# Patient Record
Sex: Male | Born: 1960 | Race: White | Hispanic: No | Smoking: Current every day smoker
Health system: Southern US, Community
[De-identification: ages and names within clinical notes are randomized; demographics above are authoritative.]

## PROBLEM LIST (undated history)

## (undated) HISTORY — PX: APPENDECTOMY: SHX54

---

## 2006-10-20 ENCOUNTER — Emergency Department: Payer: Self-pay | Admitting: Unknown Physician Specialty

## 2008-07-14 ENCOUNTER — Emergency Department: Payer: Self-pay | Admitting: Emergency Medicine

## 2008-07-16 ENCOUNTER — Emergency Department: Payer: Self-pay | Admitting: Unknown Physician Specialty

## 2009-10-20 ENCOUNTER — Emergency Department: Payer: Self-pay | Admitting: Internal Medicine

## 2009-10-21 ENCOUNTER — Emergency Department: Payer: Self-pay | Admitting: Emergency Medicine

## 2016-01-26 ENCOUNTER — Emergency Department: Payer: 59

## 2016-01-26 ENCOUNTER — Encounter: Payer: Self-pay | Admitting: Emergency Medicine

## 2016-01-26 ENCOUNTER — Emergency Department
Admission: EM | Admit: 2016-01-26 | Discharge: 2016-01-26 | Disposition: A | Discharge status: Home / Self Care | Payer: 59 | Attending: Emergency Medicine | Admitting: Emergency Medicine

## 2016-01-26 DIAGNOSIS — Y999 Unspecified external cause status: Secondary | ICD-10-CM | POA: Diagnosis not present

## 2016-01-26 DIAGNOSIS — Y92410 Unspecified street and highway as the place of occurrence of the external cause: Secondary | ICD-10-CM | POA: Diagnosis not present

## 2016-01-26 DIAGNOSIS — F17200 Nicotine dependence, unspecified, uncomplicated: Secondary | ICD-10-CM | POA: Insufficient documentation

## 2016-01-26 DIAGNOSIS — M25512 Pain in left shoulder: Secondary | ICD-10-CM | POA: Diagnosis not present

## 2016-01-26 DIAGNOSIS — Y9389 Activity, other specified: Secondary | ICD-10-CM | POA: Diagnosis not present

## 2016-01-26 DIAGNOSIS — M7918 Myalgia, other site: Secondary | ICD-10-CM

## 2016-01-26 MED ORDER — HYDROCODONE-ACETAMINOPHEN 5-325 MG PO TABS
1.0000 | ORAL_TABLET | Freq: Once | ORAL | Status: AC
  Administered 2016-01-26: 1 via ORAL
  Filled 2016-01-26: qty 1

## 2016-01-26 MED ORDER — CYCLOBENZAPRINE HCL 10 MG PO TABS
10.0000 mg | ORAL_TABLET | Freq: Once | ORAL | Status: AC
  Administered 2016-01-26: 10 mg via ORAL
  Filled 2016-01-26: qty 1

## 2016-01-26 MED ORDER — CYCLOBENZAPRINE HCL 10 MG PO TABS: 10.0000 mg | ORAL_TABLET | Freq: Three times a day (TID) | ORAL | 0 refills | Status: DC | PRN

## 2016-01-26 MED ORDER — IBUPROFEN 800 MG PO TABS: 800.0000 mg | ORAL_TABLET | Freq: Three times a day (TID) | ORAL | 0 refills | Status: DC | PRN

## 2016-01-26 MED ORDER — IBUPROFEN 800 MG PO TABS
800.0000 mg | ORAL_TABLET | Freq: Once | ORAL | Status: AC
  Administered 2016-01-26: 800 mg via ORAL
  Filled 2016-01-26: qty 1

## 2016-01-26 NOTE — ED Notes (Signed)

## 2016-01-26 NOTE — ED Notes (Signed)
Patient transported to X-ray 

## 2016-01-26 NOTE — ED Triage Notes (Signed)
philly collar applied 
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
## 2016-03-29 ENCOUNTER — Emergency Department: Payer: 59

## 2016-03-29 ENCOUNTER — Encounter: Payer: Self-pay | Admitting: Emergency Medicine

## 2016-03-29 ENCOUNTER — Emergency Department
Admission: EM | Admit: 2016-03-29 | Discharge: 2016-03-29 | Disposition: A | Discharge status: Home / Self Care | Payer: 59 | Attending: Emergency Medicine | Admitting: Emergency Medicine

## 2016-03-29 DIAGNOSIS — Z23 Encounter for immunization: Secondary | ICD-10-CM | POA: Insufficient documentation

## 2016-03-29 DIAGNOSIS — F17200 Nicotine dependence, unspecified, uncomplicated: Secondary | ICD-10-CM | POA: Diagnosis not present

## 2016-03-29 DIAGNOSIS — W3400XA Accidental discharge from unspecified firearms or gun, initial encounter: Secondary | ICD-10-CM | POA: Insufficient documentation

## 2016-03-29 DIAGNOSIS — Y929 Unspecified place or not applicable: Secondary | ICD-10-CM | POA: Insufficient documentation

## 2016-03-29 DIAGNOSIS — Y9389 Activity, other specified: Secondary | ICD-10-CM | POA: Insufficient documentation

## 2016-03-29 DIAGNOSIS — S8991XA Unspecified injury of right lower leg, initial encounter: Secondary | ICD-10-CM | POA: Diagnosis present

## 2016-03-29 DIAGNOSIS — S81801A Unspecified open wound, right lower leg, initial encounter: Secondary | ICD-10-CM | POA: Insufficient documentation

## 2016-03-29 DIAGNOSIS — Z791 Long term (current) use of non-steroidal anti-inflammatories (NSAID): Secondary | ICD-10-CM | POA: Diagnosis not present

## 2016-03-29 DIAGNOSIS — Y999 Unspecified external cause status: Secondary | ICD-10-CM | POA: Diagnosis not present

## 2016-03-29 LAB — COMPREHENSIVE METABOLIC PANEL
ALT: 25 U/L (ref 17–63)
ANION GAP: 11 (ref 5–15)
AST: 35 U/L (ref 15–41)
Albumin: 4.1 g/dL (ref 3.5–5.0)
Alkaline Phosphatase: 64 U/L (ref 38–126)
BUN: 12 mg/dL (ref 6–20)
CALCIUM: 9.3 mg/dL (ref 8.9–10.3)
CHLORIDE: 107 mmol/L (ref 101–111)
CO2: 22 mmol/L (ref 22–32)
Creatinine, Ser: 0.72 mg/dL (ref 0.61–1.24)
GFR calc non Af Amer: >60 mL/min (ref >60)
Glucose, Bld: 91 mg/dL (ref 65–99)
Potassium: 4.6 mmol/L (ref 3.5–5.1)
SODIUM: 140 mmol/L (ref 135–145)
Total Bilirubin: 1 mg/dL (ref 0.3–1.2)
Total Protein: 7.7 g/dL (ref 6.5–8.1)

## 2016-03-29 LAB — CBC
HCT: 41.7 % (ref 40.0–52.0)
Hemoglobin: 14.6 g/dL (ref 13.0–18.0)
MCH: 34.1 pg — AB (ref 26.0–34.0)
MCHC: 35.1 g/dL (ref 32.0–36.0)
MCV: 97.2 fL (ref 80.0–100.0)
PLATELETS: 244 10*3/uL (ref 150–440)
RBC: 4.29 MIL/uL — ABNORMAL LOW (ref 4.40–5.90)
RDW: 14.4 % (ref 11.5–14.5)
WBC: 9.6 10*3/uL (ref 3.8–10.6)

## 2016-03-29 LAB — TYPE AND SCREEN
ABO/RH(D): O POS
ANTIBODY SCREEN: NEGATIVE

## 2016-03-29 MED ORDER — IOPAMIDOL (ISOVUE-370) INJECTION 76%
125.0000 mL | Freq: Once | INTRAVENOUS | Status: AC | PRN
  Administered 2016-03-29: 100 mL via INTRAVENOUS

## 2016-03-29 MED ORDER — MORPHINE SULFATE (PF) 4 MG/ML IV SOLN
4.0000 mg | Freq: Once | INTRAVENOUS | Status: AC
  Administered 2016-03-29: 4 mg via INTRAVENOUS

## 2016-03-29 MED ORDER — MORPHINE SULFATE (PF) 4 MG/ML IV SOLN
INTRAVENOUS | Status: AC
  Filled 2016-03-29: qty 1

## 2016-03-29 MED ORDER — TETANUS-DIPHTH-ACELL PERTUSSIS 5-2.5-18.5 LF-MCG/0.5 IM SUSP
0.5000 mL | Freq: Once | INTRAMUSCULAR | Status: AC
  Administered 2016-03-29: 0.5 mL via INTRAMUSCULAR

## 2016-03-29 MED ORDER — ONDANSETRON HCL 4 MG/2ML IJ SOLN
4.0000 mg | Freq: Once | INTRAMUSCULAR | Status: AC
  Administered 2016-03-29: 4 mg via INTRAVENOUS
  Filled 2016-03-29: qty 2

## 2016-03-29 MED ORDER — TETANUS-DIPHTH-ACELL PERTUSSIS 5-2.5-18.5 LF-MCG/0.5 IM SUSP
INTRAMUSCULAR | Status: AC
  Filled 2016-03-29: qty 0.5

## 2016-03-29 MED ORDER — MORPHINE SULFATE (PF) 4 MG/ML IV SOLN
4.0000 mg | Freq: Once | INTRAVENOUS | Status: AC
  Administered 2016-03-29: 4 mg via INTRAVENOUS
  Filled 2016-03-29: qty 1

## 2016-03-29 MED ORDER — OXYCODONE-ACETAMINOPHEN 5-325 MG PO TABS: 1.0000 | ORAL_TABLET | Freq: Four times a day (QID) | ORAL | 0 refills | Status: AC | PRN

## 2016-03-29 NOTE — ED Notes (Signed)
Call bell at right side. Bed adjusted for comfort and warm blankets draped on pt.

## 2016-03-29 NOTE — ED Notes (Signed)
Clothing placed in paper bags.
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
## 2016-03-29 NOTE — ED Notes (Signed)
Report to rebecca, rn.  
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
## 2016-03-29 NOTE — ED Notes (Signed)
Received report from April RN.
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
## 2016-03-29 NOTE — ED Notes (Signed)
Patient transported to CT 
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
## 2017-12-08 ENCOUNTER — Emergency Department: Payer: 59

## 2017-12-08 ENCOUNTER — Encounter: Payer: Self-pay | Admitting: Emergency Medicine

## 2017-12-08 ENCOUNTER — Other Ambulatory Visit: Payer: Self-pay

## 2017-12-08 ENCOUNTER — Emergency Department
Admission: EM | Admit: 2017-12-08 | Discharge: 2017-12-08 | Disposition: A | Discharge status: Home / Self Care | Payer: 59 | Attending: Emergency Medicine | Admitting: Emergency Medicine

## 2017-12-08 DIAGNOSIS — R0781 Pleurodynia: Secondary | ICD-10-CM

## 2017-12-08 DIAGNOSIS — F17200 Nicotine dependence, unspecified, uncomplicated: Secondary | ICD-10-CM | POA: Diagnosis not present

## 2017-12-08 MED ORDER — LIDOCAINE 5 % EX PTCH: 1.0000 | MEDICATED_PATCH | CUTANEOUS | 0 refills | Status: AC

## 2017-12-08 MED ORDER — IBUPROFEN 600 MG PO TABS: 600.0000 mg | ORAL_TABLET | Freq: Four times a day (QID) | ORAL | 0 refills | Status: DC | PRN

## 2017-12-08 MED ORDER — LIDOCAINE 5 % EX PTCH
1.0000 | MEDICATED_PATCH | CUTANEOUS | Status: DC
  Administered 2017-12-08: 1 via TRANSDERMAL
  Filled 2017-12-08: qty 1

## 2017-12-08 MED ORDER — TRAMADOL HCL 50 MG PO TABS: 50.0000 mg | ORAL_TABLET | Freq: Four times a day (QID) | ORAL | 0 refills | Status: DC | PRN

## 2017-12-08 MED ORDER — KETOROLAC TROMETHAMINE 30 MG/ML IJ SOLN
30.0000 mg | Freq: Once | INTRAMUSCULAR | Status: AC
  Administered 2017-12-08: 30 mg via INTRAMUSCULAR
  Filled 2017-12-08: qty 1
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
## 2017-12-08 NOTE — ED Notes (Signed)
Explained importance of deep breathing q 1 hour while awake and bracing with pillow to prevent pneumonia.
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
## 2018-08-31 ENCOUNTER — Inpatient Hospital Stay (HOSPITAL_COMMUNITY)
Admission: EM | Admit: 2018-08-31 | Discharge: 2018-09-02 | DRG: 683 | Discharge status: Against Medical Advice | Payer: Self-pay | Attending: Internal Medicine | Admitting: Internal Medicine

## 2018-08-31 ENCOUNTER — Encounter (HOSPITAL_COMMUNITY): Payer: Self-pay | Admitting: Emergency Medicine

## 2018-08-31 ENCOUNTER — Other Ambulatory Visit (HOSPITAL_COMMUNITY): Payer: Self-pay

## 2018-08-31 ENCOUNTER — Emergency Department (HOSPITAL_COMMUNITY): Payer: Self-pay

## 2018-08-31 ENCOUNTER — Other Ambulatory Visit: Payer: Self-pay

## 2018-08-31 DIAGNOSIS — F10939 Alcohol use, unspecified with withdrawal, unspecified: Secondary | ICD-10-CM

## 2018-08-31 DIAGNOSIS — R825 Elevated urine levels of drugs, medicaments and biological substances: Secondary | ICD-10-CM

## 2018-08-31 DIAGNOSIS — Y900 Blood alcohol level of less than 20 mg/100 ml: Secondary | ICD-10-CM | POA: Diagnosis present

## 2018-08-31 DIAGNOSIS — F10239 Alcohol dependence with withdrawal, unspecified: Secondary | ICD-10-CM | POA: Diagnosis present

## 2018-08-31 DIAGNOSIS — M6282 Rhabdomyolysis: Secondary | ICD-10-CM | POA: Diagnosis present

## 2018-08-31 DIAGNOSIS — F17210 Nicotine dependence, cigarettes, uncomplicated: Secondary | ICD-10-CM | POA: Diagnosis present

## 2018-08-31 DIAGNOSIS — R7401 Elevation of levels of liver transaminase levels: Secondary | ICD-10-CM

## 2018-08-31 DIAGNOSIS — T699XXA Effect of reduced temperature, unspecified, initial encounter: Secondary | ICD-10-CM | POA: Diagnosis present

## 2018-08-31 DIAGNOSIS — N19 Unspecified kidney failure: Secondary | ICD-10-CM

## 2018-08-31 DIAGNOSIS — F10229 Alcohol dependence with intoxication, unspecified: Secondary | ICD-10-CM | POA: Diagnosis present

## 2018-08-31 DIAGNOSIS — N179 Acute kidney failure, unspecified: Principal | ICD-10-CM

## 2018-08-31 DIAGNOSIS — S80212A Abrasion, left knee, initial encounter: Secondary | ICD-10-CM | POA: Diagnosis present

## 2018-08-31 DIAGNOSIS — X31XXXA Exposure to excessive natural cold, initial encounter: Secondary | ICD-10-CM

## 2018-08-31 DIAGNOSIS — Z1159 Encounter for screening for other viral diseases: Secondary | ICD-10-CM

## 2018-08-31 DIAGNOSIS — S80211A Abrasion, right knee, initial encounter: Secondary | ICD-10-CM | POA: Diagnosis present

## 2018-08-31 DIAGNOSIS — R740 Nonspecific elevation of levels of transaminase and lactic acid dehydrogenase [LDH]: Secondary | ICD-10-CM

## 2018-08-31 LAB — RENAL FUNCTION PANEL
Albumin: 3.3 g/dL — ABNORMAL LOW (ref 3.5–5.0)
Anion gap: 15 (ref 5–15)
BUN: 31 mg/dL — ABNORMAL HIGH (ref 6–20)
CO2: 22 mmol/L (ref 22–32)
Calcium: 7 mg/dL — ABNORMAL LOW (ref 8.9–10.3)
Chloride: 99 mmol/L (ref 98–111)
Creatinine, Ser: 4.12 mg/dL — ABNORMAL HIGH (ref 0.61–1.24)
GFR calc Af Amer: 17 mL/min — ABNORMAL LOW (ref >60)
GFR calc non Af Amer: 15 mL/min — ABNORMAL LOW (ref >60)
Glucose, Bld: 140 mg/dL — ABNORMAL HIGH (ref 70–99)
Phosphorus: 9.3 mg/dL — ABNORMAL HIGH (ref 2.5–4.6)
Potassium: 4 mmol/L (ref 3.5–5.1)
Sodium: 136 mmol/L (ref 135–145)

## 2018-08-31 LAB — RAPID URINE DRUG SCREEN, HOSP PERFORMED
Amphetamines: NOT DETECTED
Barbiturates: NOT DETECTED
Benzodiazepines: NOT DETECTED
Cocaine: NOT DETECTED
Opiates: POSITIVE — AB
Tetrahydrocannabinol: POSITIVE — AB

## 2018-08-31 LAB — URINALYSIS, ROUTINE W REFLEX MICROSCOPIC
Bilirubin Urine: NEGATIVE
Glucose, UA: NEGATIVE mg/dL
Ketones, ur: NEGATIVE mg/dL
Nitrite: NEGATIVE
Protein, ur: 100 mg/dL — AB
Specific Gravity, Urine: <1.005 — ABNORMAL LOW (ref 1.005–1.030)
pH: 6 (ref 5.0–8.0)

## 2018-08-31 LAB — COMPREHENSIVE METABOLIC PANEL
ALT: 165 U/L — ABNORMAL HIGH (ref 0–44)
AST: 448 U/L — ABNORMAL HIGH (ref 15–41)
Albumin: 4.5 g/dL (ref 3.5–5.0)
Alkaline Phosphatase: 102 U/L (ref 38–126)
Anion gap: 29 — ABNORMAL HIGH (ref 5–15)
BUN: 20 mg/dL (ref 6–20)
CO2: 12 mmol/L — ABNORMAL LOW (ref 22–32)
Calcium: 9.2 mg/dL (ref 8.9–10.3)
Chloride: 103 mmol/L (ref 98–111)
Creatinine, Ser: 4.71 mg/dL — ABNORMAL HIGH (ref 0.61–1.24)
GFR calc Af Amer: 15 mL/min — ABNORMAL LOW (ref >60)
GFR calc non Af Amer: 13 mL/min — ABNORMAL LOW (ref >60)
Glucose, Bld: 182 mg/dL — ABNORMAL HIGH (ref 70–99)
Potassium: 4.7 mmol/L (ref 3.5–5.1)
Sodium: 144 mmol/L (ref 135–145)
Total Bilirubin: 0.7 mg/dL (ref 0.3–1.2)
Total Protein: 8.4 g/dL — ABNORMAL HIGH (ref 6.5–8.1)

## 2018-08-31 LAB — CBC
HCT: 52.3 % — ABNORMAL HIGH (ref 39.0–52.0)
Hemoglobin: 17.2 g/dL — ABNORMAL HIGH (ref 13.0–17.0)
MCH: 34.3 pg — ABNORMAL HIGH (ref 26.0–34.0)
MCHC: 32.9 g/dL (ref 30.0–36.0)
MCV: 104.2 fL — ABNORMAL HIGH (ref 80.0–100.0)
Platelets: 286 10*3/uL (ref 150–400)
RBC: 5.02 MIL/uL (ref 4.22–5.81)
RDW: 14.5 % (ref 11.5–15.5)
WBC: 20.2 10*3/uL — ABNORMAL HIGH (ref 4.0–10.5)
nRBC: 0 % (ref 0.0–0.2)

## 2018-08-31 LAB — SARS CORONAVIRUS 2 BY RT PCR (HOSPITAL ORDER, PERFORMED IN ~~LOC~~ HOSPITAL LAB): SARS Coronavirus 2: NEGATIVE

## 2018-08-31 LAB — URINALYSIS, MICROSCOPIC (REFLEX)

## 2018-08-31 LAB — CK: Total CK: >50000 U/L — ABNORMAL HIGH (ref 49–397)

## 2018-08-31 LAB — MRSA PCR SCREENING: MRSA by PCR: NEGATIVE

## 2018-08-31 LAB — ETHANOL: Alcohol, Ethyl (B): <10 mg/dL (ref <10)

## 2018-08-31 MED ORDER — THIAMINE HCL 100 MG/ML IJ SOLN
100.0000 mg | Freq: Every day | INTRAMUSCULAR | Status: DC
  Administered 2018-08-31 – 2018-09-02 (×3): 100 mg via INTRAVENOUS
  Filled 2018-08-31 (×3): qty 2

## 2018-08-31 MED ORDER — HEPARIN SODIUM (PORCINE) 5000 UNIT/ML IJ SOLN
5000.0000 [IU] | Freq: Three times a day (TID) | INTRAMUSCULAR | Status: DC
  Administered 2018-08-31 – 2018-09-01 (×5): 5000 [IU] via SUBCUTANEOUS
  Filled 2018-08-31 (×5): qty 1

## 2018-08-31 MED ORDER — SENNOSIDES-DOCUSATE SODIUM 8.6-50 MG PO TABS: 1.0000 | ORAL_TABLET | Freq: Every evening | ORAL | Status: DC | PRN

## 2018-08-31 MED ORDER — CHLORDIAZEPOXIDE HCL 25 MG PO CAPS: 25.0000 mg | ORAL_CAPSULE | Freq: Four times a day (QID) | ORAL | Status: DC

## 2018-08-31 MED ORDER — LACTATED RINGERS IV BOLUS
1000.0000 mL | Freq: Once | INTRAVENOUS | Status: AC
  Administered 2018-08-31: 1000 mL via INTRAVENOUS

## 2018-08-31 MED ORDER — LORAZEPAM 2 MG/ML IJ SOLN
2.0000 mg | INTRAMUSCULAR | Status: DC | PRN
  Administered 2018-08-31 – 2018-09-01 (×4): 2 mg via INTRAVENOUS
  Filled 2018-08-31 (×4): qty 1

## 2018-08-31 MED ORDER — ACETAMINOPHEN 325 MG PO TABS: 650.0000 mg | ORAL_TABLET | Freq: Four times a day (QID) | ORAL | Status: DC | PRN

## 2018-08-31 MED ORDER — SODIUM CHLORIDE 0.9 % IV SOLN
INTRAVENOUS | Status: DC
  Administered 2018-08-31 (×2): via INTRAVENOUS

## 2018-08-31 MED ORDER — NICOTINE 21 MG/24HR TD PT24: 21.0000 mg | MEDICATED_PATCH | Freq: Every day | TRANSDERMAL | Status: DC

## 2018-08-31 MED ORDER — FOLIC ACID 1 MG PO TABS: 1.0000 mg | ORAL_TABLET | Freq: Every day | ORAL | Status: DC

## 2018-08-31 MED ORDER — SODIUM CHLORIDE 0.9 % IV BOLUS
1000.0000 mL | Freq: Once | INTRAVENOUS | Status: AC
  Administered 2018-08-31: 09:00:00 1000 mL via INTRAVENOUS

## 2018-08-31 MED ORDER — PROSIGHT PO TABS
1.0000 | ORAL_TABLET | Freq: Every day | ORAL | Status: DC
  Administered 2018-08-31 – 2018-09-02 (×3): 1 via ORAL
  Filled 2018-08-31 (×4): qty 1

## 2018-08-31 MED ORDER — SODIUM BICARBONATE 8.4 % IV SOLN
INTRAVENOUS | Status: DC
  Administered 2018-08-31 (×2): via INTRAVENOUS
  Filled 2018-08-31 (×4): qty 150

## 2018-08-31 MED ORDER — CHLORDIAZEPOXIDE HCL 25 MG PO CAPS
25.0000 mg | ORAL_CAPSULE | Freq: Three times a day (TID) | ORAL | Status: DC
  Administered 2018-08-31 – 2018-09-02 (×6): 25 mg via ORAL
  Filled 2018-08-31 (×6): qty 1

## 2018-08-31 MED ORDER — LORAZEPAM 2 MG/ML IJ SOLN
2.0000 mg | Freq: Once | INTRAMUSCULAR | Status: AC
  Administered 2018-08-31: 2 mg via INTRAVENOUS
  Filled 2018-08-31: qty 1

## 2018-08-31 MED ORDER — ACETAMINOPHEN 650 MG RE SUPP: 650.0000 mg | Freq: Four times a day (QID) | RECTAL | Status: DC | PRN

## 2018-08-31 MED ORDER — CHLORDIAZEPOXIDE HCL 25 MG PO CAPS: 25.0000 mg | ORAL_CAPSULE | Freq: Three times a day (TID) | ORAL | Status: DC

## 2018-08-31 MED ORDER — NICOTINE 21 MG/24HR TD PT24
21.0000 mg | MEDICATED_PATCH | Freq: Once | TRANSDERMAL | Status: AC
  Administered 2018-08-31: 21 mg via TRANSDERMAL
  Filled 2018-08-31: qty 1

## 2018-08-31 MED ORDER — FOLIC ACID 5 MG/ML IJ SOLN
1.0000 mg | Freq: Every day | INTRAMUSCULAR | Status: DC
  Administered 2018-08-31 – 2018-09-01 (×2): 1 mg via INTRAVENOUS
  Filled 2018-08-31 (×3): qty 0.2

## 2018-08-31 MED ORDER — NICOTINE 21 MG/24HR TD PT24
21.0000 mg | MEDICATED_PATCH | Freq: Every day | TRANSDERMAL | Status: DC
  Administered 2018-09-01: 14:00:00 21 mg via TRANSDERMAL
  Filled 2018-08-31: qty 1
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
## 2018-08-31 NOTE — Plan of Care (Signed)
  Problem: Education: Goal: Knowledge of General Education information will improve Description: Including pain rating scale, medication(s)/side effects and non-pharmacologic comfort measures Outcome: Progressing   Problem: Safety: Goal: Ability to remain free from injury will improve Outcome: Progressing   

## 2018-08-31 NOTE — ED Notes (Signed)
Admitting team to the bedside.  Notified them for concern for withdrawal.

## 2018-08-31 NOTE — ED Notes (Signed)
Bair Hugger applied to patient.
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
## 2018-08-31 NOTE — ED Notes (Signed)
Pt is exhibiting symptoms of withdrawal, CIWA 16.  Admitting consultant and EDP notified
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
## 2018-09-01 ENCOUNTER — Inpatient Hospital Stay (HOSPITAL_COMMUNITY): Payer: Self-pay

## 2018-09-01 DIAGNOSIS — N179 Acute kidney failure, unspecified: Secondary | ICD-10-CM

## 2018-09-01 DIAGNOSIS — Z720 Tobacco use: Secondary | ICD-10-CM

## 2018-09-01 LAB — CBC
HCT: 41.2 % (ref 39.0–52.0)
Hemoglobin: 14.6 g/dL (ref 13.0–17.0)
MCH: 34.3 pg — ABNORMAL HIGH (ref 26.0–34.0)
MCHC: 35.4 g/dL (ref 30.0–36.0)
MCV: 96.7 fL (ref 80.0–100.0)
Platelets: 165 10*3/uL (ref 150–400)
RBC: 4.26 MIL/uL (ref 4.22–5.81)
RDW: 14.3 % (ref 11.5–15.5)
WBC: 15.6 10*3/uL — ABNORMAL HIGH (ref 4.0–10.5)
nRBC: 0 % (ref 0.0–0.2)

## 2018-09-01 LAB — RENAL FUNCTION PANEL
Albumin: 3.2 g/dL — ABNORMAL LOW (ref 3.5–5.0)
Albumin: 3.2 g/dL — ABNORMAL LOW (ref 3.5–5.0)
Albumin: 3.4 g/dL — ABNORMAL LOW (ref 3.5–5.0)
Anion gap: 16 — ABNORMAL HIGH (ref 5–15)
Anion gap: 18 — ABNORMAL HIGH (ref 5–15)
Anion gap: 20 — ABNORMAL HIGH (ref 5–15)
BUN: 36 mg/dL — ABNORMAL HIGH (ref 6–20)
BUN: 37 mg/dL — ABNORMAL HIGH (ref 6–20)
BUN: 46 mg/dL — ABNORMAL HIGH (ref 6–20)
CO2: 22 mmol/L (ref 22–32)
CO2: 21 mmol/L — ABNORMAL LOW (ref 22–32)
CO2: 21 mmol/L — ABNORMAL LOW (ref 22–32)
Calcium: 6.6 mg/dL — ABNORMAL LOW (ref 8.9–10.3)
Calcium: 6.6 mg/dL — ABNORMAL LOW (ref 8.9–10.3)
Calcium: 7 mg/dL — ABNORMAL LOW (ref 8.9–10.3)
Chloride: 98 mmol/L (ref 98–111)
Chloride: 97 mmol/L — ABNORMAL LOW (ref 98–111)
Chloride: 96 mmol/L — ABNORMAL LOW (ref 98–111)
Creatinine, Ser: 4.42 mg/dL — ABNORMAL HIGH (ref 0.61–1.24)
Creatinine, Ser: 4.51 mg/dL — ABNORMAL HIGH (ref 0.61–1.24)
Creatinine, Ser: 5.1 mg/dL — ABNORMAL HIGH (ref 0.61–1.24)
GFR calc Af Amer: 16 mL/min — ABNORMAL LOW (ref >60)
GFR calc Af Amer: 15 mL/min — ABNORMAL LOW (ref >60)
GFR calc Af Amer: 13 mL/min — ABNORMAL LOW (ref >60)
GFR calc non Af Amer: 14 mL/min — ABNORMAL LOW (ref >60)
GFR calc non Af Amer: 13 mL/min — ABNORMAL LOW (ref >60)
GFR calc non Af Amer: 11 mL/min — ABNORMAL LOW (ref >60)
Glucose, Bld: 117 mg/dL — ABNORMAL HIGH (ref 70–99)
Glucose, Bld: 113 mg/dL — ABNORMAL HIGH (ref 70–99)
Glucose, Bld: 98 mg/dL (ref 70–99)
Phosphorus: 8.4 mg/dL — ABNORMAL HIGH (ref 2.5–4.6)
Phosphorus: 7.6 mg/dL — ABNORMAL HIGH (ref 2.5–4.6)
Phosphorus: 7.4 mg/dL — ABNORMAL HIGH (ref 2.5–4.6)
Potassium: 4.5 mmol/L (ref 3.5–5.1)
Potassium: 3.9 mmol/L (ref 3.5–5.1)
Potassium: 3.7 mmol/L (ref 3.5–5.1)
Sodium: 136 mmol/L (ref 135–145)
Sodium: 136 mmol/L (ref 135–145)
Sodium: 137 mmol/L (ref 135–145)

## 2018-09-01 LAB — PROTIME-INR
INR: 1.1 (ref 0.8–1.2)
Prothrombin Time: 14 seconds (ref 11.4–15.2)

## 2018-09-01 LAB — CK
Total CK: >50000 U/L — ABNORMAL HIGH (ref 49–397)
Total CK: 49385 U/L — ABNORMAL HIGH (ref 49–397)

## 2018-09-01 LAB — HEPATIC FUNCTION PANEL
ALT: 253 U/L — ABNORMAL HIGH (ref 0–44)
AST: 879 U/L — ABNORMAL HIGH (ref 15–41)
Albumin: 3.2 g/dL — ABNORMAL LOW (ref 3.5–5.0)
Alkaline Phosphatase: 65 U/L (ref 38–126)
Bilirubin, Direct: 0.2 mg/dL (ref 0.0–0.2)
Indirect Bilirubin: 0.6 mg/dL (ref 0.3–0.9)
Total Bilirubin: 0.8 mg/dL (ref 0.3–1.2)
Total Protein: 6 g/dL — ABNORMAL LOW (ref 6.5–8.1)

## 2018-09-01 LAB — HEPATITIS B CORE ANTIBODY, TOTAL: Hep B Core Total Ab: NEGATIVE

## 2018-09-01 LAB — MAGNESIUM: Magnesium: 2.8 mg/dL — ABNORMAL HIGH (ref 1.7–2.4)

## 2018-09-01 LAB — HEPATITIS C ANTIBODY: HCV Ab: 0.3 s/co ratio (ref 0.0–0.9)

## 2018-09-01 LAB — HEPATITIS B SURFACE ANTIBODY, QUANTITATIVE: Hepatitis B-Post: <3.1 m[IU]/mL — ABNORMAL LOW (ref >9.9)

## 2018-09-01 LAB — HEPATITIS B SURFACE ANTIGEN: Hepatitis B Surface Ag: NEGATIVE

## 2018-09-01 LAB — HIV ANTIBODY (ROUTINE TESTING W REFLEX): HIV Screen 4th Generation wRfx: NONREACTIVE

## 2018-09-01 MED ORDER — ONDANSETRON HCL 4 MG/2ML IJ SOLN
4.0000 mg | Freq: Three times a day (TID) | INTRAMUSCULAR | Status: DC | PRN
  Administered 2018-09-02: 02:00:00 4 mg via INTRAVENOUS
  Filled 2018-09-01: qty 2

## 2018-09-01 NOTE — TOC Initial Note (Signed)
Transition of Care [facility]) - Initial/Assessment Note    Patient Details  Name: Frank [name] MRN: [id number] Date of Birth: [birth year]  Transition of Care [facility] CM/SW Contact:    Lawerance Sabal, RN Phone Number: 09/01/2018, 10:39 AM  Clinical Narrative:                 Frank [name] w patient at bedside. He is uninsured, he states he works in [employer], but later added that he last worked in June (2019?). He states that he lives at home alone. He goes to urgent care centers when he needs to see an MD, and he currently is not on any prescriptions. Patient has documented ETOH and MJ use per H&P. CSW will counsel for resources prior to DC. CM will follow for PCP appointment and MATCH if needed at DC.    Expected Discharge Plan: Home/Self Care Barriers to Discharge: Continued Medical Work up   Patient Goals and CMS Choice Patient states their goals for this hospitalization and ongoing recovery are:: to return home      Expected Discharge Plan and Services Expected Discharge Plan: Home/Self Care In-house Referral: Clinical Social Work Discharge Planning Services: CM Consult                                          Prior Living Arrangements/Services   Lives with:: Self Patient language and need for interpreter reviewed:: Yes Do you feel safe going back to the place where you live?: Yes            Criminal Activity/Legal Involvement Pertinent to Current Situation/Hospitalization: No - Comment as needed  Activities of Daily Living   ADL Screening (condition at time of admission) Patient's cognitive ability adequate to safely complete daily activities?: No Is the patient deaf or have difficulty hearing?: No Does the patient have difficulty seeing, even when wearing glasses/contacts?: No Does the patient have difficulty concentrating, remembering, or making decisions?: Yes Patient able to express need for assistance with ADLs?: No Does the patient have difficulty dressing or  bathing?: Yes Independently performs ADLs?: No Does the patient have difficulty walking or climbing stairs?: Yes Weakness of Legs: Both Weakness of Arms/Hands: None  Permission Sought/Granted                  Emotional Assessment              Admission diagnosis:  Transaminitis [R74.0] AKI (acute kidney injury) (HCC) [N17.9] Non-traumatic rhabdomyolysis [M62.82] Rhabdomyolysis [M62.82] Patient Active Problem List   Diagnosis Date Noted  . Rhabdomyolysis 08/31/2018  . Alcohol withdrawal (HCC) 08/31/2018  . Renal failure 08/31/2018   PCP:  Patient, No Pcp Per Pharmacy:   [id number] [place], [place] - [address] AT [facility] [address] [place] [address] Phone: [phone] Fax: [phone]     Social Determinants of Health (SDOH) Interventions    Readmission Risk Interventions No flowsheet data found.
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
## 2018-09-01 NOTE — Progress Notes (Signed)
Patient has not voided all night. Bladder scan shows 240 ml. Notified the on call intern. Advised to bladder scan patient again in another hour or 2 and notify.  Will continue to monitor.

## 2018-09-01 NOTE — Progress Notes (Signed)
[facility] Progress Note   Subjective:   No complaints but some disorientation this Am. I/Os yesterday 3.8 / 0.1.  Objective Vitals:   08/31/18 2016 08/31/18 2150 09/01/18 0303 09/01/18 0800  BP:  (!) 136/97 (!) 147/98 (!) 140/111  Pulse:  (!) 106 (!) 106 (!) 102  Resp:  15 (!) 21   Temp: 98.5 F (36.9 C)   97.7 F (36.5 C)  TempSrc: Oral   Oral  SpO2:  90% 95% 96%  Weight:      Height:       Physical Exam General: sitting up in bed, arousable but lethargic Heart:  Tachycardic and regular Lungs: basilar rales Abdomen: soft Extremities: trace edema   Additional Objective Labs: Basic Metabolic Panel: Recent Labs  Lab 08/31/18 2046 09/01/18 0250 09/01/18 0444  NA 136 136 136  K 4.0 4.5 3.9  CL 99 98 97*  CO2 22 22 21*  GLUCOSE 140* 117* 113*  BUN 31* 36* 37*  CREATININE 4.12* 4.42* 4.51*  CALCIUM 7.0* 6.6* 6.6*  PHOS 9.3* 8.4* 7.6*   Liver Function Tests: Recent Labs  Lab 08/31/18 0846 08/31/18 2046 09/01/18 0250 09/01/18 0444  AST 448*  --   --  879*  ALT 165*  --   --  253*  ALKPHOS 102  --   --  65  BILITOT 0.7  --   --  0.8  PROT 8.4*  --   --  6.0*  ALBUMIN 4.5 3.3* 3.2* 3.2*  3.2*   No results for input(s): LIPASE, AMYLASE in the last 168 hours. CBC: Recent Labs  Lab 08/31/18 0846  WBC 20.2*  HGB 17.2*  HCT 52.3*  MCV 104.2*  PLT 286   Blood Culture No results found for: SDES, SPECREQUEST, CULT, REPTSTATUS  Cardiac Enzymes: Recent Labs  Lab 08/31/18 0846 09/01/18 0444  CKTOTAL >50,000* >50,000*   CBG: No results for input(s): GLUCAP in the last 168 hours. Iron Studies: No results for input(s): IRON, TIBC, TRANSFERRIN, FERRITIN in the last 72 hours. @lablastinr3 @ Studies/Results: US Abdomen Complete  Result Date: 09/01/2018 CLINICAL DATA:  58 year old male with transaminitis and acute kidney injury EXAM: ABDOMEN ULTRASOUND COMPLETE COMPARISON:  None. FINDINGS: Gallbladder: No gallstones or wall thickening  visualized. No sonographic Murphy sign noted by sonographer. Common bile duct: Diameter: 4 mm-5 mm Liver: Increased echogenicity of liver parenchyma with no focal lesion. No nodular contour. IVC: No abnormality visualized. Pancreas: Visualized portion unremarkable. Spleen: Size and appearance within normal limits. Right Kidney: Length: 12.2 cm. Echogenicity within normal limits. No mass or hydronephrosis visualized. Left Kidney: Length: 12.7 cm. Echogenicity within normal limits. No mass or hydronephrosis visualized. Abdominal aorta: No aneurysm visualized. Other findings: None. IMPRESSION: No evidence of hydronephrosis with relatively unremarkable sonographic survey of the kidneys. Increased echogenicity of liver parenchyma, potentially steatosis or other medical liver disease. Electronically Signed   By: Gilmer Mor D.O.   On: 09/01/2018 09:16   Dg Chest Portable 1 View  Result Date: 08/31/2018 CLINICAL DATA:  Leukocytosis.  Hypothermia EXAM: PORTABLE CHEST 1 VIEW COMPARISON:  12/08/2017 FINDINGS: Heart size upper normal. Negative for heart failure. COPD with hyperinflation. No acute infiltrate or effusion. IMPRESSION: No acute abnormality.  Probable chronic lung disease. Electronically Signed   By: Marlan Palau M.D.   On: 08/31/2018 10:06   Medications: . sodium chloride Stopped (09/01/18 0756)   . chlordiazePOXIDE  25 mg Oral TID  . folic acid  1 mg Intravenous Daily  . heparin  5,000 Units  Subcutaneous Q8H  . multivitamin  1 tablet Oral Daily  . nicotine  21 mg Transdermal Once  . nicotine  21 mg Transdermal Daily  . thiamine injection  100 mg Intravenous Daily    Assessment/Plan **AKI secondary to rhabdomyolysis:  CK still > 50,000.  Electrolytes ok but oliguric now with rales and mild edema.  Stop IVF.  Renal US unrevealing.  No indications for RRT currently but may develop - would keep NPO in case needs catheter placed tomorrow.    **High AG metabolic acidosis:  Likely secondary to  renal failure, EtOH; denies other ingestions. Improved.    **Tobacco use: nicotine patch.   **EtOH WD: on CIWA per primary, vitamin repletion; he is asking for resources on discharge to help with EtOH abstinence.   **leukocytosis:  Afebrile, no localizing s/s infection.  Suspect stress response to trauma.   Will follow closely, please page with questions/concerns.   Estill BakesLindsay Braxon Suder MD 09/01/2018, 10:19 AM  [facility] Pager: [phone]

## 2018-09-02 DIAGNOSIS — D72829 Elevated white blood cell count, unspecified: Secondary | ICD-10-CM

## 2018-09-02 DIAGNOSIS — Z5329 Procedure and treatment not carried out because of patient's decision for other reasons: Secondary | ICD-10-CM

## 2018-09-02 DIAGNOSIS — R7401 Elevation of levels of liver transaminase levels: Secondary | ICD-10-CM

## 2018-09-02 LAB — CBC
HCT: 37.3 % — ABNORMAL LOW (ref 39.0–52.0)
Hemoglobin: 13.6 g/dL (ref 13.0–17.0)
MCH: 34.4 pg — ABNORMAL HIGH (ref 26.0–34.0)
MCHC: 36.5 g/dL — ABNORMAL HIGH (ref 30.0–36.0)
MCV: 94.4 fL (ref 80.0–100.0)
Platelets: 155 10*3/uL (ref 150–400)
RBC: 3.95 MIL/uL — ABNORMAL LOW (ref 4.22–5.81)
RDW: 13.9 % (ref 11.5–15.5)
WBC: 14.2 10*3/uL — ABNORMAL HIGH (ref 4.0–10.5)
nRBC: 0 % (ref 0.0–0.2)

## 2018-09-02 LAB — HEPATIC FUNCTION PANEL
ALT: 260 U/L — ABNORMAL HIGH (ref 0–44)
AST: 648 U/L — ABNORMAL HIGH (ref 15–41)
Albumin: 2.9 g/dL — ABNORMAL LOW (ref 3.5–5.0)
Alkaline Phosphatase: 69 U/L (ref 38–126)
Bilirubin, Direct: <0.1 mg/dL (ref 0.0–0.2)
Total Bilirubin: 0.8 mg/dL (ref 0.3–1.2)
Total Protein: 6.1 g/dL — ABNORMAL LOW (ref 6.5–8.1)

## 2018-09-02 LAB — RENAL FUNCTION PANEL
Albumin: 2.9 g/dL — ABNORMAL LOW (ref 3.5–5.0)
Anion gap: 20 — ABNORMAL HIGH (ref 5–15)
BUN: 53 mg/dL — ABNORMAL HIGH (ref 6–20)
CO2: 21 mmol/L — ABNORMAL LOW (ref 22–32)
Calcium: 6.8 mg/dL — ABNORMAL LOW (ref 8.9–10.3)
Chloride: 95 mmol/L — ABNORMAL LOW (ref 98–111)
Creatinine, Ser: 5.79 mg/dL — ABNORMAL HIGH (ref 0.61–1.24)
GFR calc Af Amer: 11 mL/min — ABNORMAL LOW (ref >60)
GFR calc non Af Amer: 10 mL/min — ABNORMAL LOW (ref >60)
Glucose, Bld: 103 mg/dL — ABNORMAL HIGH (ref 70–99)
Phosphorus: 6.7 mg/dL — ABNORMAL HIGH (ref 2.5–4.6)
Potassium: 3.6 mmol/L (ref 3.5–5.1)
Sodium: 136 mmol/L (ref 135–145)

## 2018-09-02 LAB — CK: Total CK: 19071 U/L — ABNORMAL HIGH (ref 49–397)

## 2018-09-02 MED ORDER — SODIUM CHLORIDE 0.9 % IV SOLN: INTRAVENOUS | Status: DC

## 2018-09-02 NOTE — Progress Notes (Signed)
[facility] Progress Note   Subjective:   Signing out AMA. I/Os yesterday 1.5/1.0. Cr worse this AM.   Objective Vitals:   09/02/18 0305 09/02/18 0700 09/02/18 0800 09/02/18 0900  BP: (!) 136/98     Pulse: (!) 103 (!) 105 95 (!) 102  Resp: 10 18 16 14   Temp: 98 F (36.7 C)     TempSrc: Oral     SpO2: 95% 96% 95% 97%  Weight:      Height:       Physical Exam General: putting on clothes to leave Heart:  Tachycardic and regular Lungs: normal WOB Extremities: trace edema   Additional Objective Labs: Basic Metabolic Panel: Recent Labs  Lab 09/01/18 0444 09/01/18 1716 09/02/18 0518  NA 136 137 136  K 3.9 3.7 3.6  CL 97* 96* 95*  CO2 21* 21* 21*  GLUCOSE 113* 98 103*  BUN 37* 46* 53*  CREATININE 4.51* 5.10* 5.79*  CALCIUM 6.6* 7.0* 6.8*  PHOS 7.6* 7.4* 6.7*   Liver Function Tests: Recent Labs  Lab 08/31/18 0846  09/01/18 0444 09/01/18 1716 09/02/18 0518  AST 448*  --  879*  --  648*  ALT 165*  --  253*  --  260*  ALKPHOS 102  --  65  --  69  BILITOT 0.7  --  0.8  --  0.8  PROT 8.4*  --  6.0*  --  6.1*  ALBUMIN 4.5   < > 3.2*  3.2* 3.4* 2.9*  2.9*   < > = values in this interval not displayed.   No results for input(s): LIPASE, AMYLASE in the last 168 hours. CBC: Recent Labs  Lab 08/31/18 0846 09/01/18 0925 09/02/18 0518  WBC 20.2* 15.6* 14.2*  HGB 17.2* 14.6 13.6  HCT 52.3* 41.2 37.3*  MCV 104.2* 96.7 94.4  PLT 286 165 155   Blood Culture No results found for: SDES, SPECREQUEST, CULT, REPTSTATUS  Cardiac Enzymes: Recent Labs  Lab 08/31/18 0846 09/01/18 0444 09/01/18 1716 09/02/18 0518  CKTOTAL >50,000* >50,000* 49,385* 19,071*   CBG: No results for input(s): GLUCAP in the last 168 hours. Iron Studies: No results for input(s): IRON, TIBC, TRANSFERRIN, FERRITIN in the last 72 hours. @lablastinr3 @ Studies/Results: [place] Abdomen Complete  Result Date: 09/01/2018 CLINICAL DATA:  58 year old male with transaminitis and acute  kidney injury EXAM: ABDOMEN ULTRASOUND COMPLETE COMPARISON:  None. FINDINGS: Gallbladder: No gallstones or wall thickening visualized. No sonographic Murphy sign noted by sonographer. Common bile duct: Diameter: 4 mm-5 mm Liver: Increased echogenicity of liver parenchyma with no focal lesion. No nodular contour. IVC: No abnormality visualized. Pancreas: Visualized portion unremarkable. Spleen: Size and appearance within normal limits. Right Kidney: Length: 12.2 cm. Echogenicity within normal limits. No mass or hydronephrosis visualized. Left Kidney: Length: 12.7 cm. Echogenicity within normal limits. No mass or hydronephrosis visualized. Abdominal aorta: No aneurysm visualized. Other findings: None. IMPRESSION: No evidence of hydronephrosis with relatively unremarkable sonographic survey of the kidneys. Increased echogenicity of liver parenchyma, potentially steatosis or other medical liver disease. Electronically Signed   By: Gilmer MorJaime  Wagner D.O.   On: 09/01/2018 09:16   Dg Chest Portable 1 View  Result Date: 08/31/2018 CLINICAL DATA:  Leukocytosis.  Hypothermia EXAM: PORTABLE CHEST 1 VIEW COMPARISON:  12/08/2017 FINDINGS: Heart size upper normal. Negative for heart failure. COPD with hyperinflation. No acute infiltrate or effusion. IMPRESSION: No acute abnormality.  Probable chronic lung disease. Electronically Signed   By: Marlan Palauharles  Clark M.D.   On: 08/31/2018 10:06  Medications: . sodium chloride     . chlordiazePOXIDE  25 mg Oral TID  . folic acid  1 mg Intravenous Daily  . heparin  5,000 Units Subcutaneous Q8H  . multivitamin  1 tablet Oral Daily  . nicotine  21 mg Transdermal Daily  . thiamine injection  100 mg Intravenous Daily    Assessment/Plan **AKI secondary to rhabdomyolysis:  CK improving.   Renal US unrevealing.  No indications for RRT currently and I think barring further insults it's unlikely, though rising cr is concerning. In light of his leaving AMA I told him it could be life  threatening given worsening renal function.  Advised to return immediately if anuric, worsening dyspnea, cramps, confusion or any other new symptoms.   **High AG metabolic acidosis:  Likely secondary to renal failure, EtOH; denies other ingestions. Improved.    **Tobacco use: nicotine patch.   **EtOH WD: on CIWA per primary, vitamin repletion; he is asking for resources on discharge to help with EtOH abstinence.   **leukocytosis:  Afebrile, no localizing s/s infection.  Suspect stress response to trauma.   He says he will come see me in clinic.  I gave him my card with the office number.  He could not tell me his phone number so I cannot have office all to schedule.   Estill Bakes MD 09/02/2018, 9:18 AM  [facility] Pager: [phone]

## 2018-09-02 NOTE — Progress Notes (Signed)
IVs removed and charge RN walked pt to main entrance
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
## 2018-09-02 NOTE — Progress Notes (Signed)
Pt has called his ride and is ready to leave AMA when they arrive. MD Rogelia Boga aware.
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
## 2018-09-09 ENCOUNTER — Inpatient Hospital Stay: Payer: Self-pay
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
## 2020-12-05 IMAGING — DX PORTABLE CHEST - 1 VIEW
1 series · 1 of 1 positions shown · non-contrast
Comparison: 12/08/2017

CLINICAL DATA: Leukocytosis.  Hypothermia

EXAM:
PORTABLE CHEST 1 VIEW

[chest]
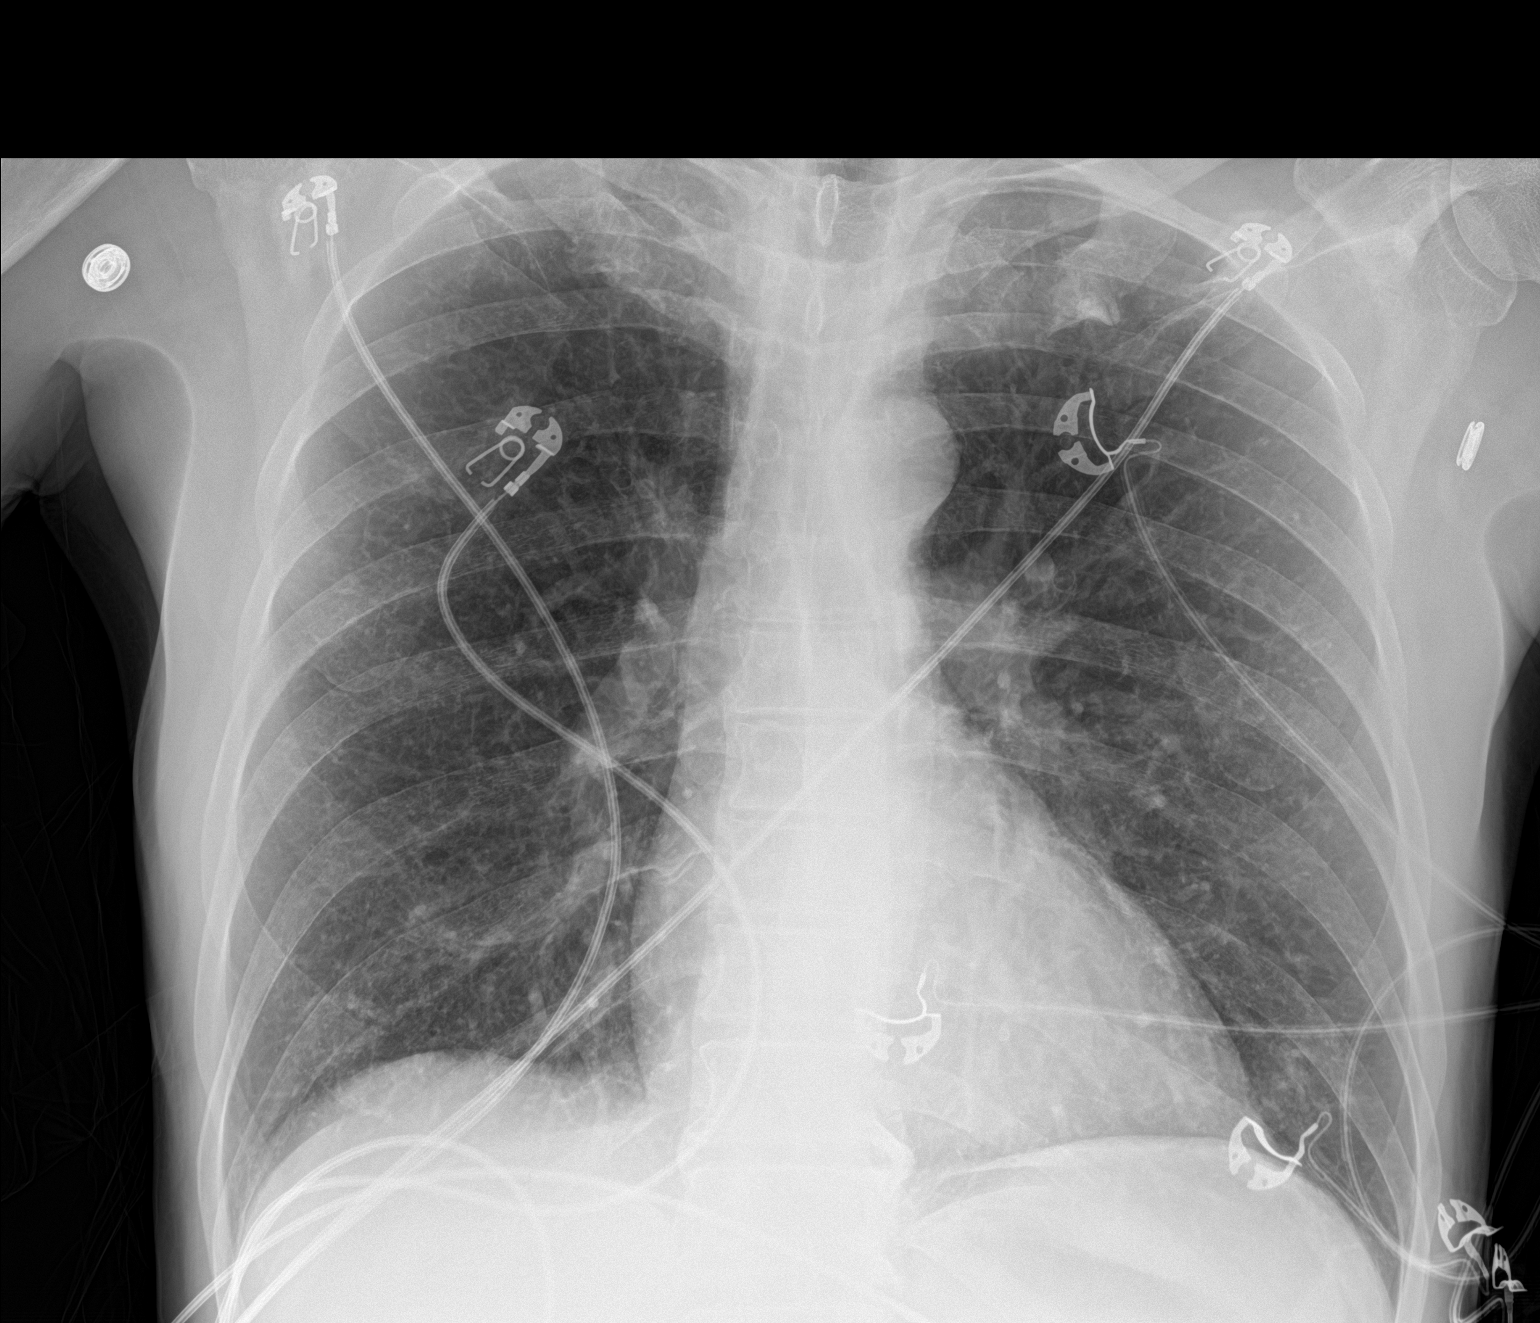

[1 of 1 positions shown; findings below may reference images not displayed]

FINDINGS: Heart size upper normal. Negative for heart failure. COPD with
hyperinflation. No acute infiltrate or effusion.
IMPRESSION: No acute abnormality.  Probable chronic lung disease.

## 2022-09-20 DEATH — deceased
